# Patient Record
Sex: Male | Born: 1980 | Race: Black or African American | Hispanic: No | Marital: Married | Smoking: Never smoker
Health system: Southern US, Community
[De-identification: ages and names within clinical notes are randomized; demographics above are authoritative.]

---

## 2000-04-19 ENCOUNTER — Emergency Department (HOSPITAL_COMMUNITY): Admission: EM | Admit: 2000-04-19 | Discharge: 2000-04-19 | Payer: Self-pay | Admitting: Emergency Medicine

## 2000-04-23 ENCOUNTER — Emergency Department (HOSPITAL_COMMUNITY): Admission: EM | Admit: 2000-04-23 | Discharge: 2000-04-23 | Payer: Self-pay | Admitting: Emergency Medicine

## 2002-03-01 ENCOUNTER — Encounter: Payer: Self-pay | Admitting: Emergency Medicine

## 2002-03-01 ENCOUNTER — Emergency Department (HOSPITAL_COMMUNITY): Admission: EM | Admit: 2002-03-01 | Discharge: 2002-03-01 | Payer: Self-pay | Admitting: Emergency Medicine

## 2004-04-25 ENCOUNTER — Emergency Department (HOSPITAL_COMMUNITY): Admission: EM | Admit: 2004-04-25 | Discharge: 2004-04-25 | Payer: Self-pay | Admitting: Family Medicine

## 2004-05-04 ENCOUNTER — Emergency Department (HOSPITAL_COMMUNITY): Admission: EM | Admit: 2004-05-04 | Discharge: 2004-05-04 | Payer: Self-pay | Admitting: Family Medicine

## 2004-07-25 ENCOUNTER — Emergency Department (HOSPITAL_COMMUNITY): Admission: EM | Admit: 2004-07-25 | Discharge: 2004-07-25 | Payer: Self-pay | Admitting: Family Medicine

## 2005-07-03 IMAGING — CR DG CHEST 2V
2 series · 2 of 2 positions shown · non-contrast
Comparison: none

CLINICAL DATA: Cough, short of breath, sweats for two days.  Smoker of one half pack per day.
 CHEST, TWO VIEWS:
 PA and lateral views of the chest are made without previous films for direct comparison and show a mild diffuse peribronchial thickening without evidence of consolidation, pleural effusion, or pneumothorax.  The heart and mediastinum appear to be within the normal limit.  Bones appear normal.

[view not recorded (1 of 2)]
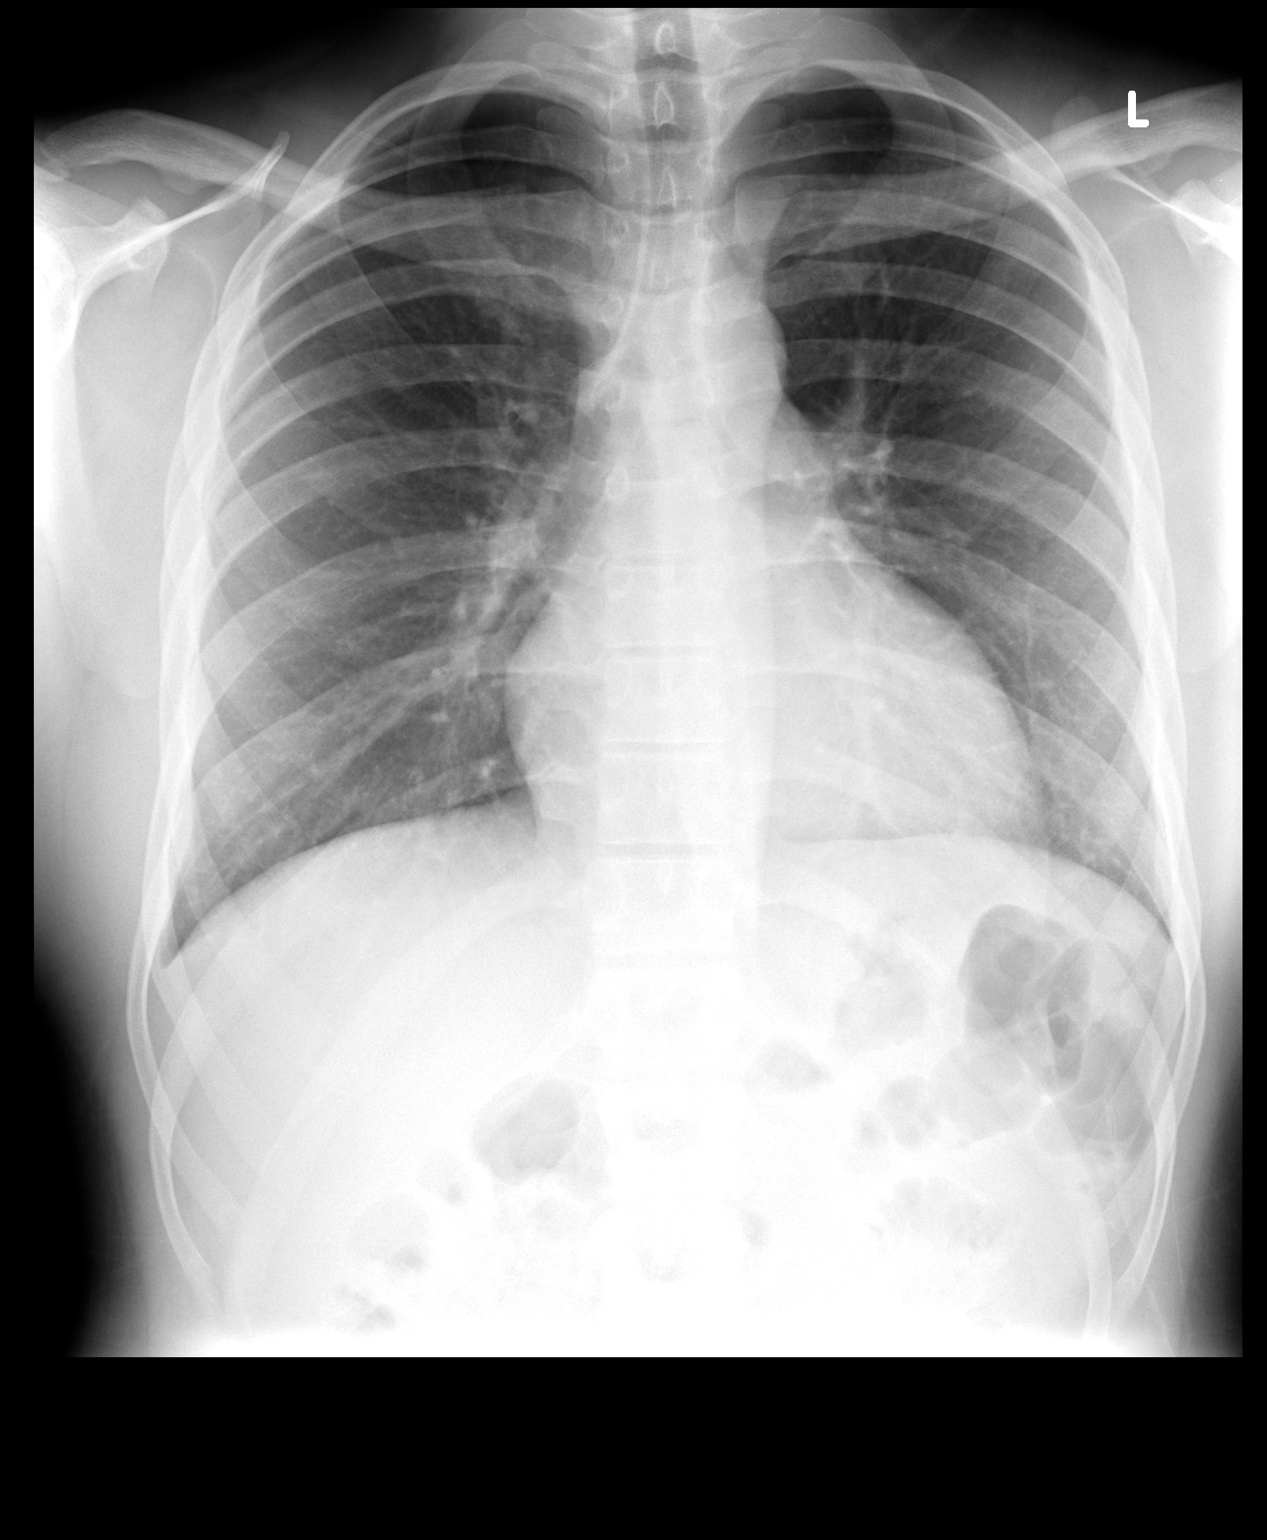

[view not recorded (2 of 2)]
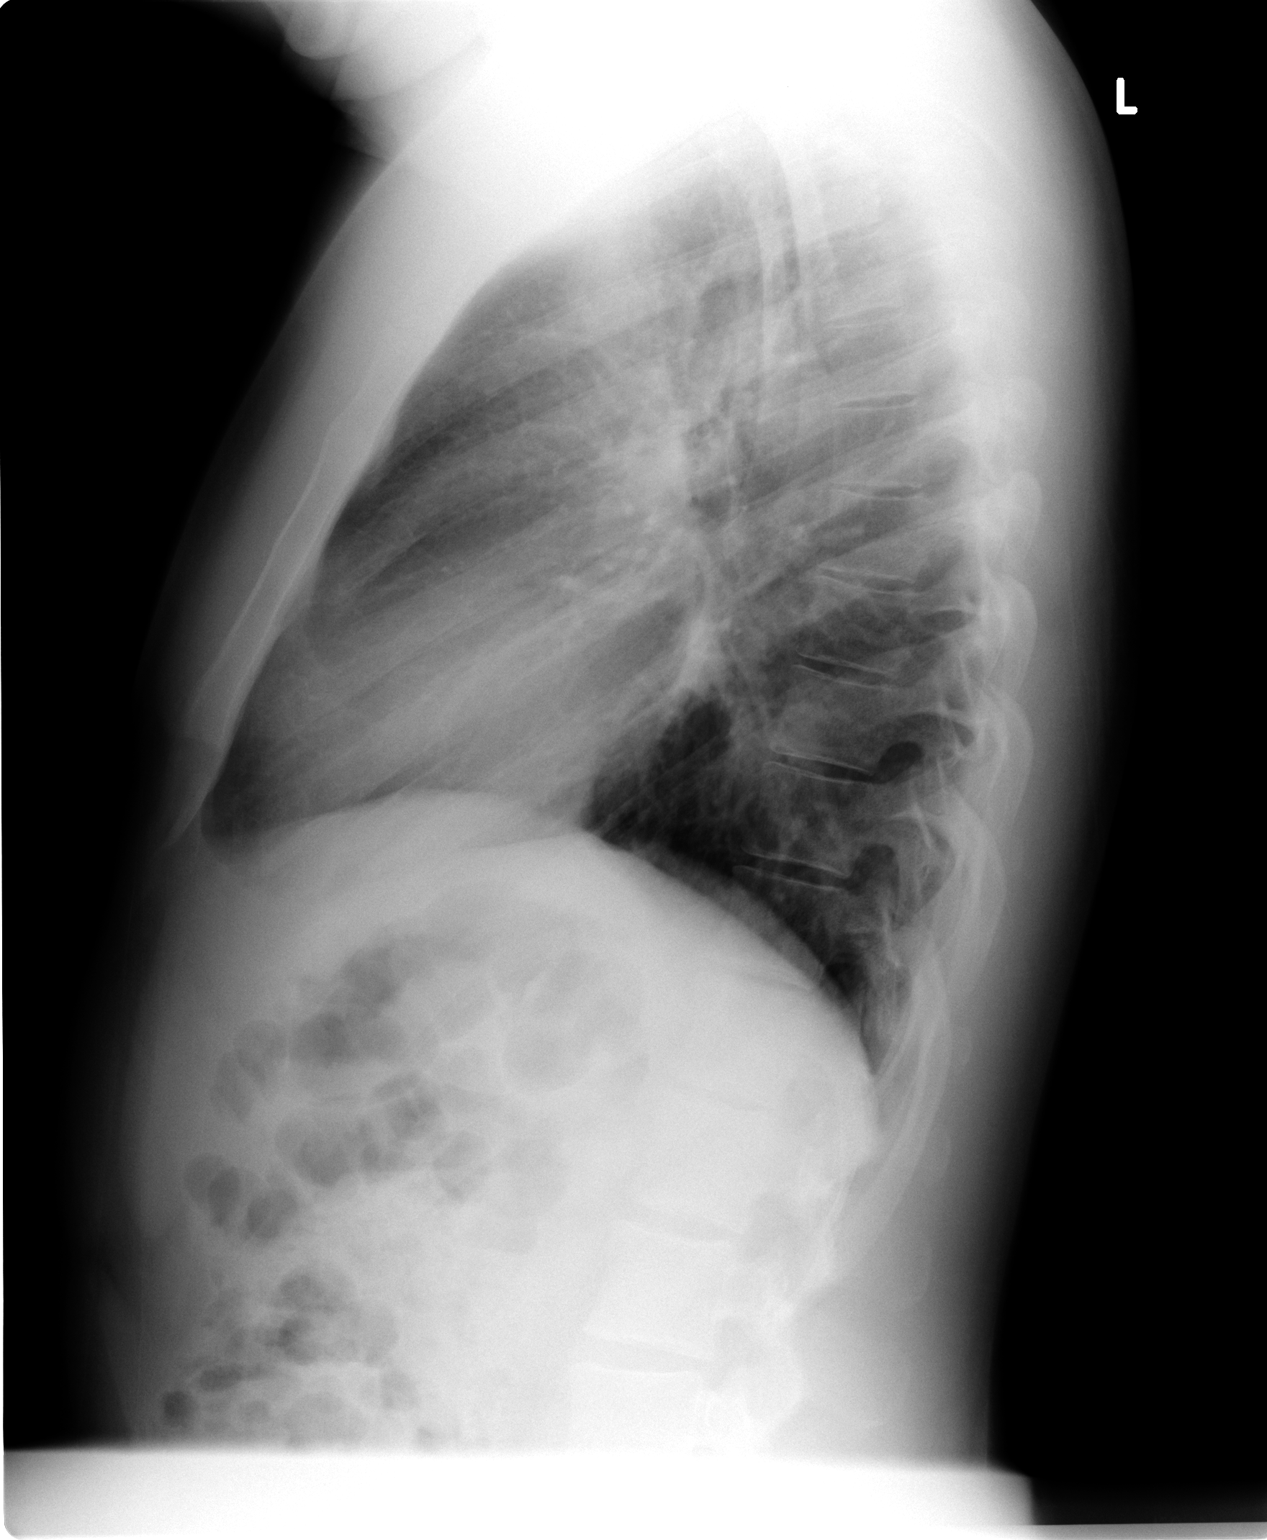

[2 of 2 positions shown; findings below may reference images not displayed]

IMPRESSION: Mild diffuse peribronchial thickening.  No evidence of active disease.

## 2009-02-19 ENCOUNTER — Ambulatory Visit: Payer: Self-pay | Admitting: Family Medicine

## 2009-02-19 DIAGNOSIS — IMO0002 Reserved for concepts with insufficient information to code with codable children: Secondary | ICD-10-CM | POA: Insufficient documentation

## 2009-02-19 LAB — CONVERTED CEMR LAB
Basophils Absolute: 0 10*3/uL (ref 0.0–0.1)
Basophils Relative: 0 % (ref 0–1)
Blood in Urine, dipstick: NEGATIVE
Eosinophils Absolute: 0.1 10*3/uL (ref 0.0–0.7)
Eosinophils Relative: 1 % (ref 0–5)
Glucose, Urine, Semiquant: NEGATIVE
HCT: 47.5 % (ref 39.0–52.0)
Hemoglobin: 16 g/dL (ref 13.0–17.0)
Ketones, urine, test strip: NEGATIVE
Lymphocytes Relative: 39 % (ref 12–46)
Lymphs Abs: 3.3 10*3/uL (ref 0.7–4.0)
MCHC: 33.7 g/dL (ref 30.0–36.0)
MCV: 90.6 fL (ref 78.0–100.0)
Monocytes Absolute: 1 10*3/uL (ref 0.1–1.0)
Monocytes Relative: 12 % (ref 3–12)
Neutro Abs: 4 10*3/uL (ref 1.7–7.7)
Neutrophils Relative %: 47 % (ref 43–77)
Nitrite: NEGATIVE
Platelets: 249 10*3/uL (ref 150–400)
RBC: 5.24 M/uL (ref 4.22–5.81)
RDW: 13.1 % (ref 11.5–15.5)
Specific Gravity, Urine: >=1.03
Urobilinogen, UA: 0.2
WBC Urine, dipstick: NEGATIVE
WBC: 8.4 10*3/uL (ref 4.0–10.5)
pH: 5

## 2018-11-27 ENCOUNTER — Ambulatory Visit: Payer: Self-pay | Admitting: Allergy

## 2020-05-04 ENCOUNTER — Emergency Department (INDEPENDENT_AMBULATORY_CARE_PROVIDER_SITE_OTHER): Payer: Managed Care, Other (non HMO)

## 2020-05-04 ENCOUNTER — Encounter: Payer: Self-pay | Admitting: Emergency Medicine

## 2020-05-04 ENCOUNTER — Emergency Department (INDEPENDENT_AMBULATORY_CARE_PROVIDER_SITE_OTHER)
Admission: EM | Admit: 2020-05-04 | Discharge: 2020-05-04 | Disposition: A | Discharge status: Home / Self Care | Payer: Managed Care, Other (non HMO) | Source: Home / Self Care

## 2020-05-04 ENCOUNTER — Other Ambulatory Visit: Payer: Self-pay

## 2020-05-04 DIAGNOSIS — R109 Unspecified abdominal pain: Secondary | ICD-10-CM | POA: Diagnosis not present

## 2020-05-04 LAB — POCT URINALYSIS DIP (MANUAL ENTRY)
Bilirubin, UA: NEGATIVE
Blood, UA: NEGATIVE
Glucose, UA: NEGATIVE mg/dL
Ketones, POC UA: NEGATIVE mg/dL
Leukocytes, UA: NEGATIVE
Nitrite, UA: NEGATIVE
Protein Ur, POC: NEGATIVE mg/dL
Spec Grav, UA: >=1.03 — AB (ref 1.010–1.025)
Urobilinogen, UA: 0.2 E.U./dL
pH, UA: 5.5 (ref 5.0–8.0)

## 2020-05-04 LAB — POCT CBC W AUTO DIFF (K'VILLE URGENT CARE)

## 2020-05-04 NOTE — ED Provider Notes (Signed)
[facility]    CSN: [id number] Arrival date & time: 05/04/20  1215      History   Chief Complaint Chief Complaint  Patient presents with  . Flank Pain    HPI Raymond Rivers is a 39 y.o. male.   HPI Raymond Rivers is a 39 y.o. male presenting to UC with c/o Right side flank pain that radiates into RLQ. Pain is aching and dull, 3/10, occasional sharp shooting pain. Pain started within the last 48 hours. No known injury.  Denies fever, chills, n/v/d. He reports being told after an endoscopy many years ago he had a hx of "diverticulitis." pain is worse when drying to abdominal exercises but denies recent injury.    History reviewed. No pertinent past medical history.  Patient Active Problem List   Diagnosis Date Noted  . OTHER SPECIFIED COMPLICATIONS NEC 02/19/2009    History reviewed. No pertinent surgical history.     Home Medications    Prior to Admission medications   Not on File    Family History Family History  Problem Relation Age of Onset  . Mental illness Mother   . Diabetes Father     Social History Social History   Tobacco Use  . Smoking status: Never Smoker  . Smokeless tobacco: Never Used  Vaping Use  . Vaping Use: Never used  Substance Use Topics  . Alcohol use: Not Currently     Allergies   Patient has no known allergies.   Review of Systems Review of Systems  Constitutional: Negative for appetite change, chills and fever.  Gastrointestinal: Positive for abdominal pain. Negative for diarrhea, nausea and vomiting.  Genitourinary: Negative for dysuria, flank pain, frequency and hematuria.  Musculoskeletal: Negative for back pain.     Physical Exam Triage Vital Signs ED Triage Vitals  Enc Vitals Group     BP 05/04/20 1247 (!) 146/82     Pulse Rate 05/04/20 1247 75     Resp 05/04/20 1247 18     Temp 05/04/20 1247 98.3 F (36.8 C)     Temp Source 05/04/20 1247 Oral     SpO2 05/04/20 1247 99 %     Weight 05/04/20  1249 220 lb (99.8 kg)     Height 05/04/20 1249 5\' 7"  (1.702 m)     Head Circumference --      Peak Flow --      Pain Score 05/04/20 1249 3     Pain Loc --      Pain Edu? --      Excl. in GC? --    No data found.  Updated Vital Signs BP (!) 146/82 (BP Location: Right Arm)   Pulse 75   Temp 98.3 F (36.8 C) (Oral)   Resp 18   Ht 5\' 7"  (1.702 m)   Wt 220 lb (99.8 kg)   SpO2 99%   BMI 34.46 kg/m   Visual Acuity Right Eye Distance:   Left Eye Distance:   Bilateral Distance:    Right Eye Near:   Left Eye Near:    Bilateral Near:     Physical Exam Vitals and nursing note reviewed.  Constitutional:      General: He is not in acute distress.    Appearance: Normal appearance. He is well-developed and well-nourished. He is not ill-appearing, toxic-appearing or diaphoretic.  HENT:     Head: Normocephalic and atraumatic.  Eyes:     Extraocular Movements: EOM normal.  Cardiovascular:  Rate and Rhythm: Normal rate and regular rhythm.  Pulmonary:     Effort: Pulmonary effort is normal. No respiratory distress.     Breath sounds: Normal breath sounds.  Abdominal:     General: There is no distension.     Palpations: Abdomen is soft. There is no mass.     Tenderness: There is no abdominal tenderness. There is no right CVA tenderness, left CVA tenderness, guarding or rebound.     Hernia: No hernia is present.  Musculoskeletal:        General: Normal range of motion.     Cervical back: Normal range of motion.  Skin:    General: Skin is warm and dry.  Neurological:     Mental Status: He is alert and oriented to person, place, and time.  Psychiatric:        Mood and Affect: Mood and affect normal.        Behavior: Behavior normal.      UC Treatments / Results  Labs (all labs ordered are listed, but only abnormal results are displayed) Labs Reviewed  POCT URINALYSIS DIP (MANUAL ENTRY) - Abnormal; Notable for the following components:      Result Value   Spec Grav, UA  >=1.030 (*)    All other components within normal limits  COMPLETE METABOLIC PANEL WITH GFR  POCT CBC W AUTO DIFF ([facility])    EKG   Radiology CT Renal Stone Study  Result Date: 05/04/2020 CLINICAL DATA:  Flank pain.  Evaluate for kidney stone EXAM: CT ABDOMEN AND PELVIS WITHOUT CONTRAST TECHNIQUE: Multidetector CT imaging of the abdomen and pelvis was performed following the standard protocol without IV contrast. COMPARISON:  None FINDINGS: Lower chest: No acute abnormality. Hepatobiliary: No focal liver abnormality is seen. No gallstones, gallbladder wall thickening, or biliary dilatation. Pancreas: Unremarkable. No pancreatic ductal dilatation or surrounding inflammatory changes. Spleen: Normal in size without focal abnormality. Adrenals/Urinary Tract: Normal adrenal glands. No kidney mass, stone, or hydronephrosis. Urinary bladder is unremarkable. Stomach/Bowel: Stomach is within normal limits. Appendix appears normal. No evidence of bowel wall thickening, distention, or inflammatory changes. Vascular/Lymphatic: No significant vascular findings are present. No enlarged abdominal or pelvic lymph nodes. Reproductive: Prostate is unremarkable. Other: No free fluid or fluid collections. Musculoskeletal: No acute or significant osseous findings. IMPRESSION: No acute findings within the abdomen or pelvis. No urinary tract calculi identified. Electronically Signed   By: Signa Kell M.D.   On: 05/04/2020 14:02    Procedures Procedures (including critical care time)  Medications Ordered in UC Medications - No data to display  Initial Impression / Assessment and Plan / UC Course  I have reviewed the triage vital signs and the nursing notes.  Pertinent labs & imaging results that were available during my care of the patient were reviewed by me and considered in my medical decision making (see chart for details).     CBC and UA: unremarkable CMP pending CT renal study: no acute  findings Discussed symptomatic tx with pt F/u with PCP as needed Discussed symptoms that warrant emergent care in the ED. AVS given  Final Clinical Impressions(s) / UC Diagnoses   Final diagnoses:  Right flank pain     Discharge Instructions      Be sure to stay well hydration. You may take tylenol and motrin as needed for pain, alternate cool and warm compresses.  Call to schedule a follow up appointment with primary care next week if not improving.  Call 911 or have someone drive you to the hospital if symptoms significantly worsening.     ED Prescriptions    None     PDMP not reviewed this encounter.   Lurene Shadow, [place] 05/04/20 1607

## 2020-05-04 NOTE — Discharge Instructions (Signed)
  Be sure to stay well hydration. You may take tylenol and motrin as needed for pain, alternate cool and warm compresses.  Call to schedule a follow up appointment with primary care next week if not improving.   Call 911 or have someone drive you to the hospital if symptoms significantly worsening.

## 2020-05-04 NOTE — ED Triage Notes (Signed)
Rt Flank pain, unable to do abdominal exercises,x 2 days  hx diverticulitis.

## 2020-05-05 LAB — COMPLETE METABOLIC PANEL WITH GFR
AG Ratio: 1.5 (calc) (ref 1.0–2.5)
ALT: 41 U/L (ref 9–46)
AST: 49 U/L — ABNORMAL HIGH (ref 10–40)
Albumin: 4.6 g/dL (ref 3.6–5.1)
Alkaline phosphatase (APISO): 58 U/L (ref 36–130)
BUN: 15 mg/dL (ref 7–25)
CO2: 29 mmol/L (ref 20–32)
Calcium: 9.6 mg/dL (ref 8.6–10.3)
Chloride: 105 mmol/L (ref 98–110)
Creat: 1.03 mg/dL (ref 0.60–1.35)
GFR, Est African American: 105 mL/min/{1.73_m2} (ref >=60)
GFR, Est Non African American: 90 mL/min/{1.73_m2} (ref >=60)
Globulin: 3.1 g/dL (calc) (ref 1.9–3.7)
Glucose, Bld: 83 mg/dL (ref 65–99)
Potassium: 4.2 mmol/L (ref 3.5–5.3)
Sodium: 139 mmol/L (ref 135–146)
Total Bilirubin: 0.8 mg/dL (ref 0.2–1.2)
Total Protein: 7.7 g/dL (ref 6.1–8.1)

## 2022-11-04 ENCOUNTER — Ambulatory Visit: Payer: 59 | Admitting: Internal Medicine

## 2023-03-26 ENCOUNTER — Ambulatory Visit: Payer: 59 | Admitting: Internal Medicine
